# Patient Record
Sex: Female | Born: 1985 | Race: Black or African American | Hispanic: No | Marital: Single | Smoking: Former smoker
Health system: Southern US, Community
[De-identification: ages and names within clinical notes are randomized; demographics above are authoritative.]

## PROBLEM LIST (undated history)

## (undated) DIAGNOSIS — I10 Essential (primary) hypertension: Secondary | ICD-10-CM

## (undated) DIAGNOSIS — E079 Disorder of thyroid, unspecified: Secondary | ICD-10-CM

## (undated) HISTORY — PX: CHOLECYSTECTOMY: SHX55

---

## 2017-12-02 ENCOUNTER — Other Ambulatory Visit: Payer: Self-pay

## 2017-12-02 ENCOUNTER — Emergency Department
Admission: EM | Admit: 2017-12-02 | Discharge: 2017-12-02 | Disposition: A | Discharge status: Home / Self Care | Payer: Medicaid Other | Attending: Emergency Medicine | Admitting: Emergency Medicine

## 2017-12-02 ENCOUNTER — Encounter: Payer: Self-pay | Admitting: Emergency Medicine

## 2017-12-02 DIAGNOSIS — T71193A Asphyxiation due to mechanical threat to breathing due to other causes, assault, initial encounter: Secondary | ICD-10-CM

## 2017-12-02 DIAGNOSIS — I10 Essential (primary) hypertension: Secondary | ICD-10-CM | POA: Diagnosis not present

## 2017-12-02 DIAGNOSIS — M542 Cervicalgia: Secondary | ICD-10-CM | POA: Diagnosis present

## 2017-12-02 DIAGNOSIS — Z87891 Personal history of nicotine dependence: Secondary | ICD-10-CM | POA: Insufficient documentation

## 2017-12-02 DIAGNOSIS — E039 Hypothyroidism, unspecified: Secondary | ICD-10-CM | POA: Insufficient documentation

## 2017-12-02 HISTORY — DX: Disorder of thyroid, unspecified: E07.9

## 2017-12-02 HISTORY — DX: Essential (primary) hypertension: I10
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
## 2018-02-04 ENCOUNTER — Emergency Department: Payer: Medicaid Other

## 2018-02-04 ENCOUNTER — Encounter: Payer: Self-pay | Admitting: Emergency Medicine

## 2018-02-04 ENCOUNTER — Emergency Department
Admission: EM | Admit: 2018-02-04 | Discharge: 2018-02-04 | Disposition: A | Discharge status: Home / Self Care | Payer: Medicaid Other | Attending: Student in an Organized Health Care Education/Training Program | Admitting: Student in an Organized Health Care Education/Training Program

## 2018-02-04 DIAGNOSIS — S3992XA Unspecified injury of lower back, initial encounter: Secondary | ICD-10-CM | POA: Diagnosis present

## 2018-02-04 DIAGNOSIS — I10 Essential (primary) hypertension: Secondary | ICD-10-CM | POA: Diagnosis not present

## 2018-02-04 DIAGNOSIS — W108XXA Fall (on) (from) other stairs and steps, initial encounter: Secondary | ICD-10-CM | POA: Diagnosis not present

## 2018-02-04 DIAGNOSIS — Y999 Unspecified external cause status: Secondary | ICD-10-CM | POA: Insufficient documentation

## 2018-02-04 DIAGNOSIS — Y92009 Unspecified place in unspecified non-institutional (private) residence as the place of occurrence of the external cause: Secondary | ICD-10-CM | POA: Diagnosis not present

## 2018-02-04 DIAGNOSIS — Y9301 Activity, walking, marching and hiking: Secondary | ICD-10-CM | POA: Diagnosis not present

## 2018-02-04 DIAGNOSIS — S39012A Strain of muscle, fascia and tendon of lower back, initial encounter: Secondary | ICD-10-CM | POA: Insufficient documentation

## 2018-02-04 DIAGNOSIS — Z87891 Personal history of nicotine dependence: Secondary | ICD-10-CM | POA: Diagnosis not present

## 2018-02-04 LAB — POCT PREGNANCY, URINE: Preg Test, Ur: NEGATIVE

## 2018-02-04 MED ORDER — LIDOCAINE 5 % EX PTCH: 1.0000 | MEDICATED_PATCH | Freq: Two times a day (BID) | CUTANEOUS | 0 refills | Status: AC

## 2018-02-04 MED ORDER — CYCLOBENZAPRINE HCL 5 MG PO TABS: 5.0000 mg | ORAL_TABLET | Freq: Three times a day (TID) | ORAL | 0 refills | Status: AC | PRN

## 2018-02-04 NOTE — ED Notes (Addendum)
Pt reports falling down 3 steps in May and hitting right side of lower back, pain subsided with IBU then recently returned when moving furniture, 8/10 aching pain now  Area appears without redness, CMS intact to extremities, and full ROM  Pt denies regular meds, conditions or allergies

## 2018-02-04 NOTE — ED Triage Notes (Signed)
Patient states that she fell down 3 stairs about 3 months ago. Patient states that she had right lower back pain times one week that was relieved with IBU. Patient states that she was moving furniture about 3 weeks ago and has started having right lower back pain since. Patient states that the pain radiates down her right leg.

## 2018-02-04 NOTE — Discharge Instructions (Signed)
Your exam and x-ray are essentially normal at this time. Take the prescription meds as directed. Take the muscle relaxant at bedtime. Apply ice or moist heat to reduce pain. Apply the pain patches as directed. Follow-up with your provider for ongoing symptoms.

## 2018-02-04 NOTE — ED Notes (Signed)
Reviewed discharge instructions, follow-up care, and prescriptions with patient. Patient verbalized understanding of all information reviewed. Patient stable, with no distress noted at this time.    

## 2018-02-04 NOTE — ED Provider Notes (Signed)
[facility] Emergency Department Provider Note ____________________________________________  Time seen: 2040  I have reviewed the triage vital signs and the nursing notes.  HISTORY  Chief Complaint  Back Pain  HPI Whitney Lawson is a 32 y.o. female resents to the ED for evaluation of ongoing right-sided low back pain.  Patient describes falling down 3 steps at home and may, at the onset of symptoms.  She has been evaluated medical provider in [facility] at the emergency department there last week.  She was discharged with instructions on management of an acute lumbar strain and contusion.  She was given a prescription for etodolac that she admits that she did not feel.  Her symptoms seem to have recurred after she admits to moving a twin bed last week.  He denies any distal paresthesias, footdrop, or incontinence.  She also denies any saddle anesthesias, or leg weakness.  She does not take any medications in the interim.  She reports that her symptoms are worsened by forward flexion.  She localizes pain to the right SI joint region.  Past Medical History:  Diagnosis Date  . Hypertension   . Thyroid disease     There are no active problems to display for this patient.   Past Surgical History:  Procedure Laterality Date  . CHOLECYSTECTOMY      Prior to Admission medications   Medication Sig Start Date End Date Taking? Authorizing Provider  cyclobenzaprine (FLEXERIL) 5 MG tablet Take 1 tablet (5 mg total) by mouth 3 (three) times daily as needed for muscle spasms. 02/04/18   Fredda Clarida, Charlesetta IvoryJenise V Bacon, PA-C  lidocaine (LIDODERM) 5 % Place 1 patch onto the skin every 12 (twelve) hours for 5 days. Remove & Discard patch within 12 hours or as directed by MD 02/04/18 02/09/18  Makeda Peeks, Charlesetta IvoryJenise V Bacon, PA-C    Allergies Patient has no known allergies.  No family history on file.  Social History Social History   Tobacco Use  . Smoking status: Former Games developermoker  .  Smokeless tobacco: Never Used  Substance Use Topics  . Alcohol use: Never    Frequency: Never  . Drug use: Never    Review of Systems  Constitutional: Negative for fever. Cardiovascular: Negative for chest pain. Respiratory: Negative for shortness of breath. Gastrointestinal: Negative for abdominal pain, vomiting and diarrhea. Genitourinary: Negative for dysuria. Musculoskeletal: Positive for back pain. Skin: Negative for rash. Neurological: Negative for headaches, focal weakness or numbness. ____________________________________________  PHYSICAL EXAM:  VITAL SIGNS: ED Triage Vitals [02/04/18 1916]  Enc Vitals Group     BP (!) 156/102     Pulse Rate 66     Resp 18     Temp 98.5 F (36.9 C)     Temp Source Oral     SpO2 99 %     Weight 280 lb (127 kg)     Height 5\' 7"  (1.702 m)     Head Circumference      Peak Flow      Pain Score 8     Pain Loc      Pain Edu?      Excl. in GC?     Constitutional: Alert and oriented. Well appearing and in no distress. Head: Normocephalic and atraumatic. Cardiovascular: Normal rate, regular rhythm. Normal distal pulses. Respiratory: Normal respiratory effort. No wheezes/rales/rhonchi. Musculoskeletal: Spinal alignment without midline tenderness, spasm, deformity, or step-off.  Patient transitions from sit to stand without assistance.  She is able to demonstrate normal lumbar flexion  and extension range.  She is also noted to have normal hip flexion and extension.  She reports tenderness to palpation over the right SI joint region.  Nontender with normal range of motion in all extremities.  Neurologic: Cranial nerves II through XII grossly tach.  Normal LE DTRs bilaterally.  Normal toe dorsiflexion foot eversion noted.  Negative seated straight leg raise bilaterally.  Normal gait without ataxia. Normal speech and language. No gross focal neurologic deficits are appreciated. Skin:  Skin is warm, dry and intact. No rash noted. Psychiatric:  Mood and affect are normal. Patient exhibits appropriate insight and judgment. ____________________________________________   LABS (pertinent positives/negatives) Labs Reviewed  POC URINE PREG, ED  POCT PREGNANCY, URINE  ____________________________________________  RADIOLOGY  Lumbar Spine IMPRESSION: Negative radiographs of the lumbar spine.  Incidental note of 6 non-rib-bearing lumbar vertebra, a normal variant. ____________________________________________  INITIAL IMPRESSION / ASSESSMENT AND PLAN / ED COURSE  Patient with ED evaluation of continued right-sided low back pain.  Patient was concerned that there was something more serious with her condition because she did not receive an x-ray on her previous visit to another ED.  She also had not healed the previous prescribed anti-inflammatory prescription.  Patient's exam is overall benign at this time with x-rays reassuring.  She will be discharged with instructions on management of a lumbar strain.  She is given prescription for Flexeril and Lidoderm patches to use addition to the previously prescribed etodolac.  Patient is encouraged to follow-up with primary provider for ongoing symptom management. ____________________________________________  FINAL CLINICAL IMPRESSION(S) / ED DIAGNOSES  Final diagnoses:  Strain of lumbar region, initial encounter      Lissa Hoard, PA-C 02/04/18 2313    Willy Eddy, MD 02/04/18 2329

## 2019-06-05 IMAGING — CR DG LUMBAR SPINE COMPLETE 4+V
1 series · 5 of 5 positions shown · non-contrast
Comparison: None.

CLINICAL DATA: Right lumbosacral back pain radiating down right
leg. Pain onset after moving furniture 3 weeks ago.

EXAM:
LUMBAR SPINE - COMPLETE 4+ VIEW

[Series 1: dg lumbar spine complete 4 +v · 0.14mm/px · 5 of 5 slices shown]
[im 1/5]
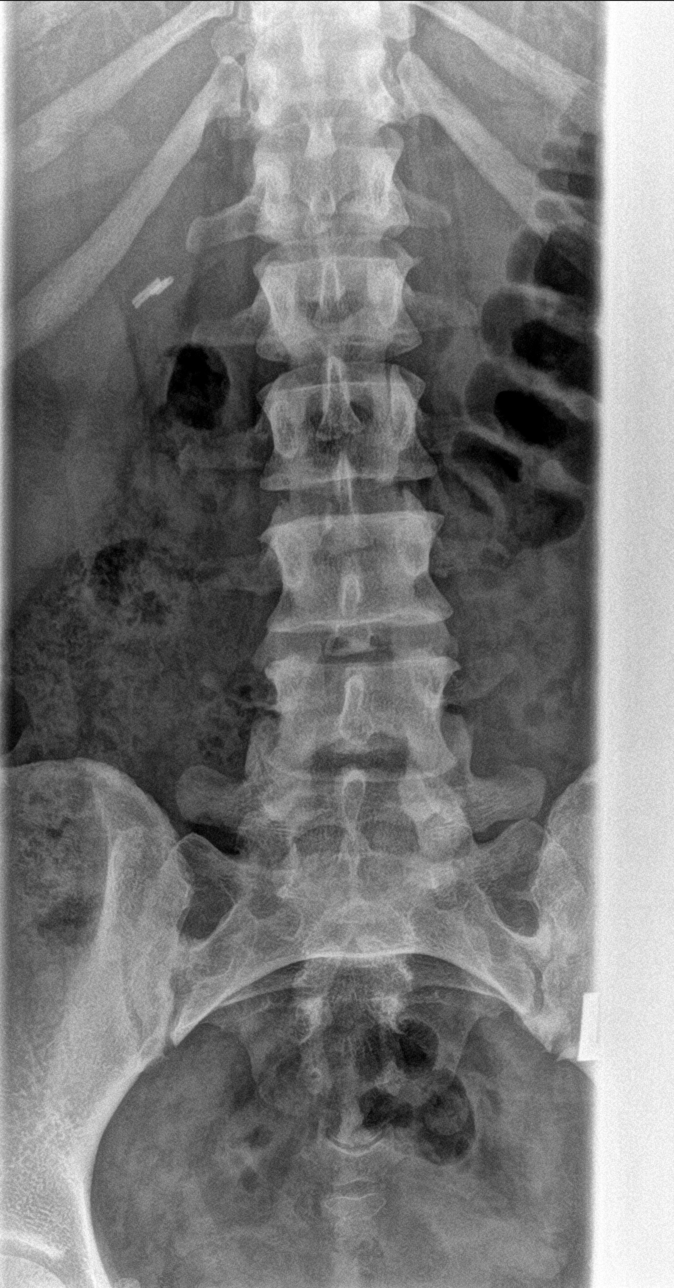
[im 2/5]
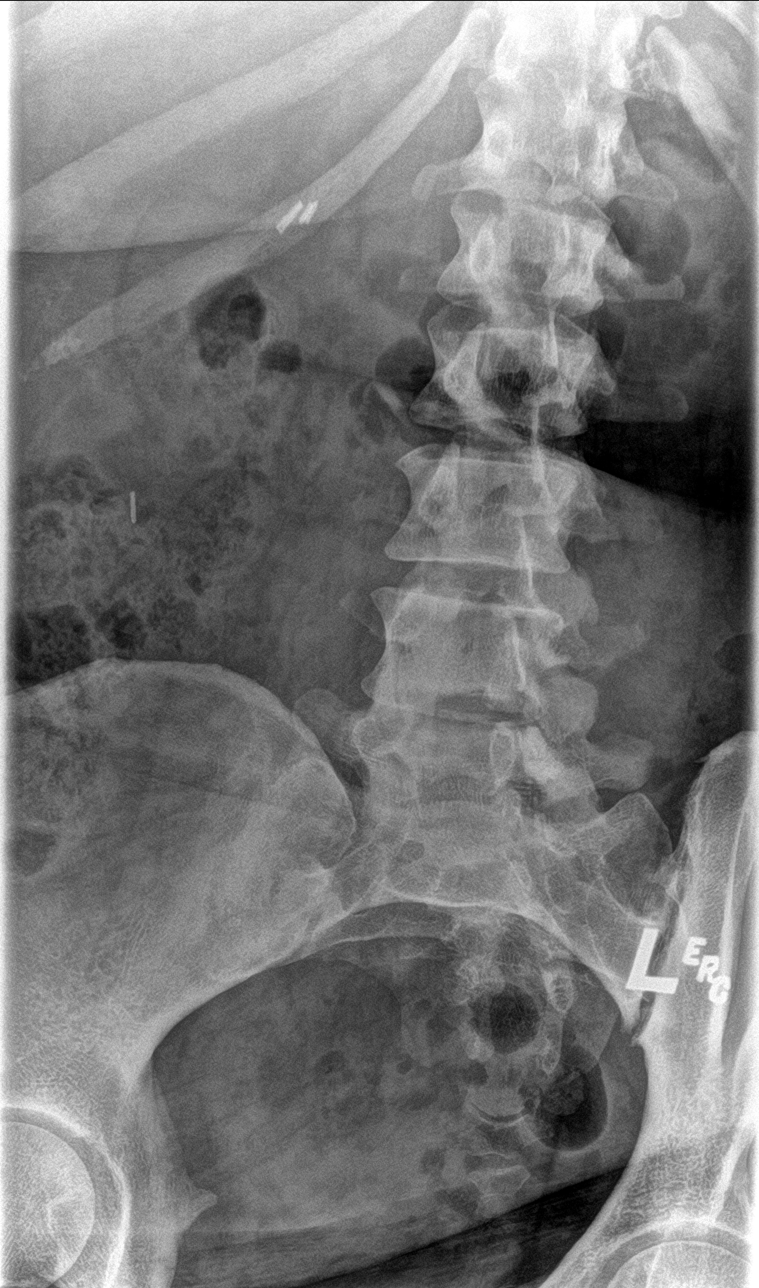
[im 3/5]
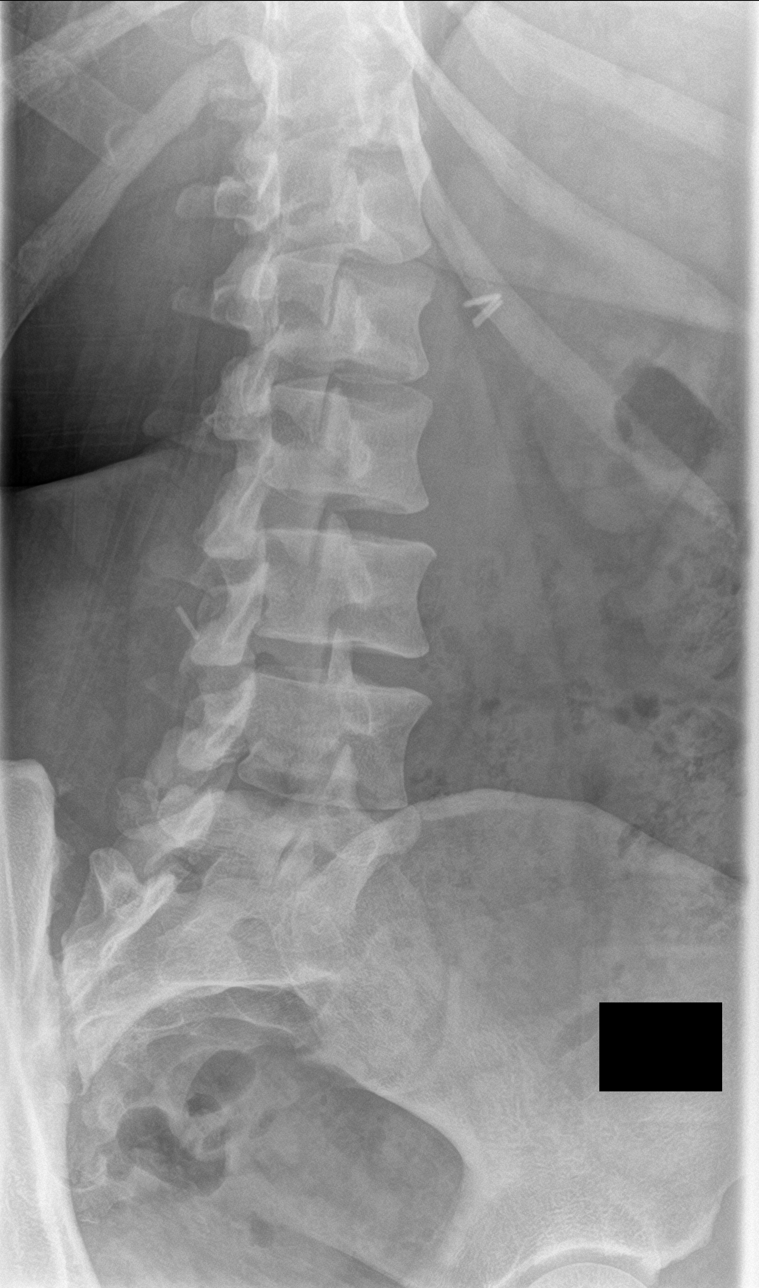
[im 4/5]
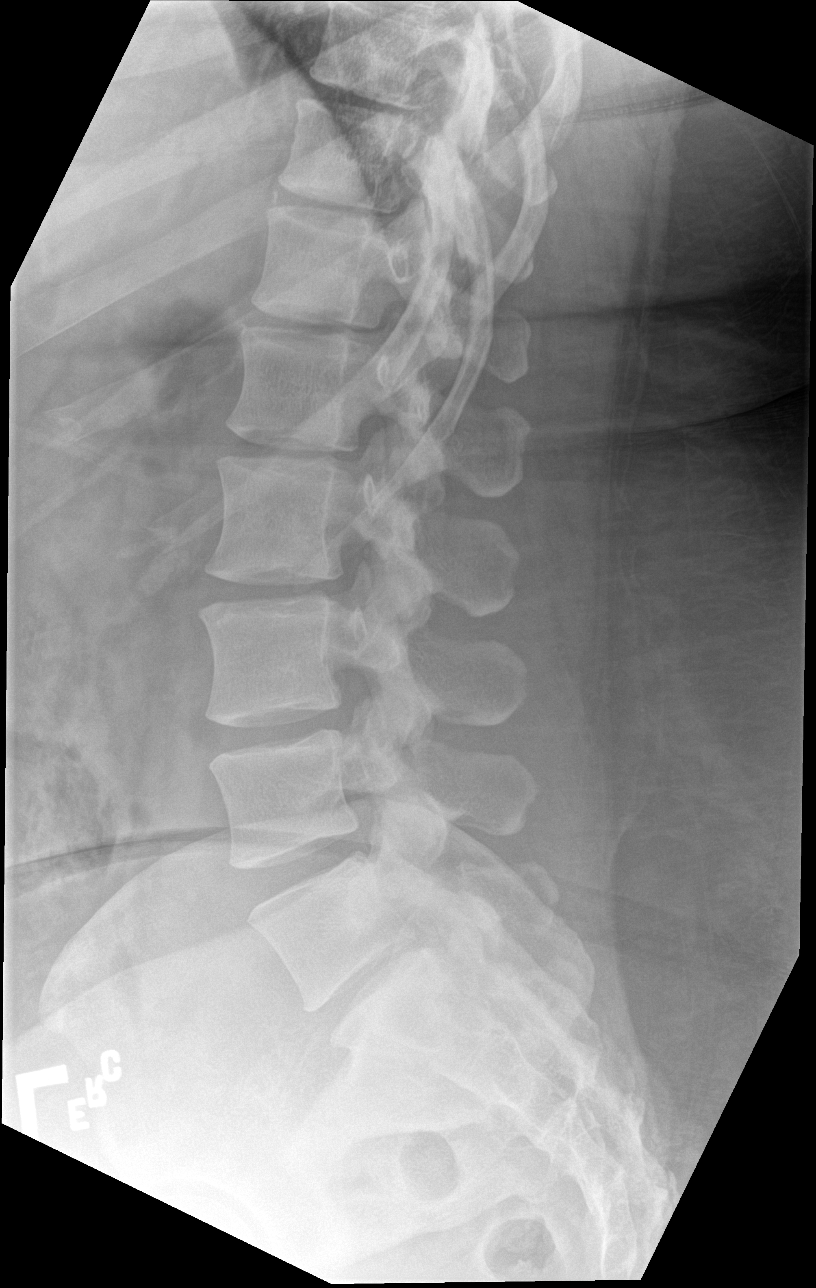
[im 5/5]
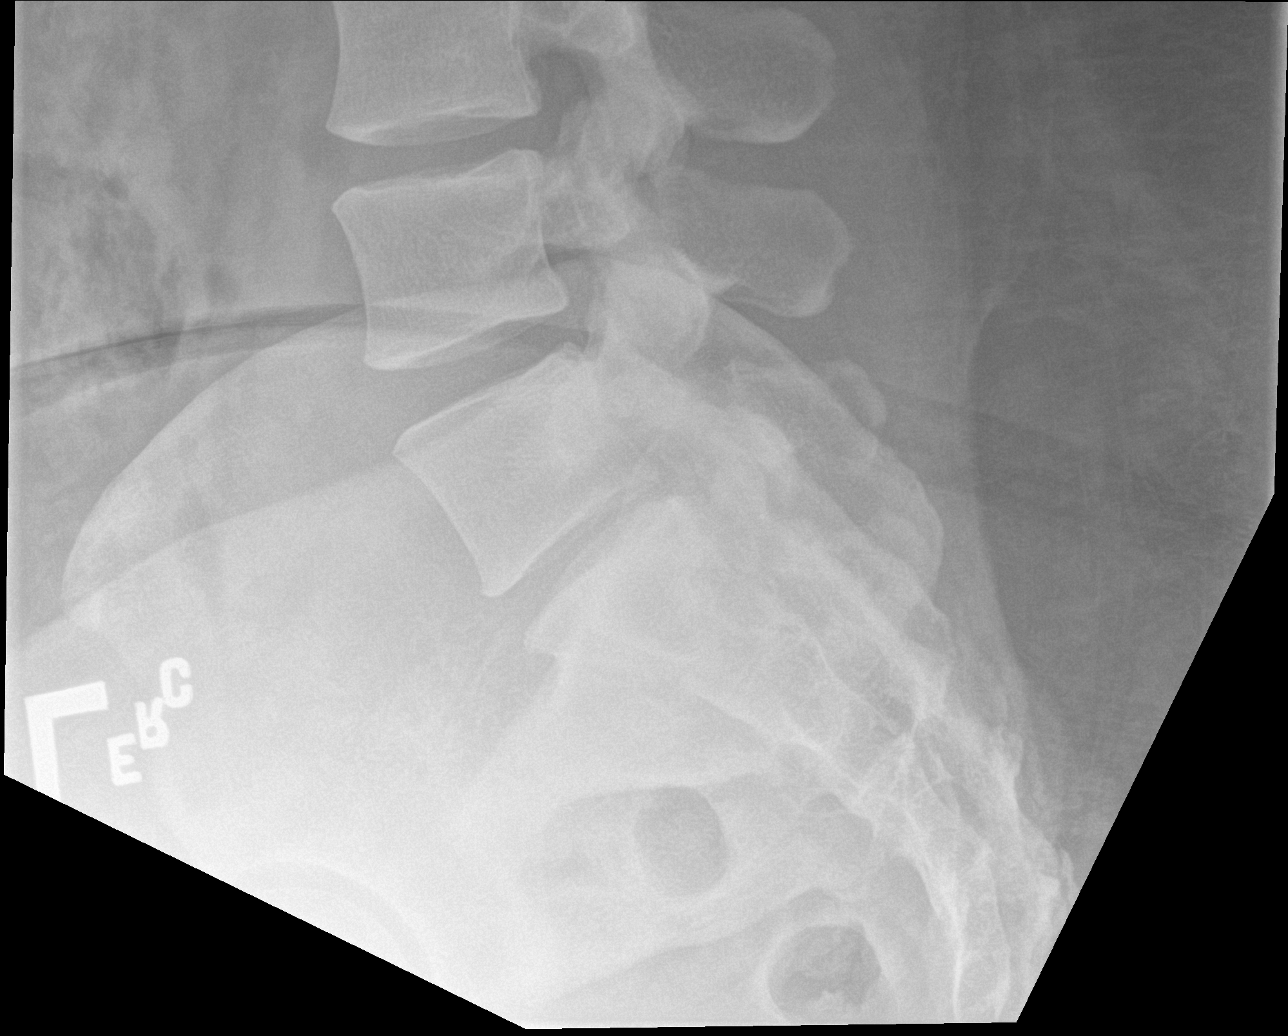

[5 of 5 positions shown; findings below may reference images not displayed]

FINDINGS: There are 6 non-rib-bearing lumbar vertebra. The alignment is
maintained. Vertebral body heights are normal. There is no
listhesis. The posterior elements are intact. Disc spaces are
preserved. No fracture. Sacroiliac joints are congruent.
IMPRESSION: Negative radiographs of the lumbar spine.

Incidental note of 6 non-rib-bearing lumbar vertebra, a normal
variant.
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
## 2024-07-18 ENCOUNTER — Ambulatory Visit: Payer: Self-pay
